# Patient Record
Sex: Male | Born: 2000 | Hispanic: Refuse to answer | Marital: Single | Smoking: Never smoker
Health system: Southern US, Community
[De-identification: ages and names within clinical notes are randomized; demographics above are authoritative.]

---
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
## 2018-01-01 ENCOUNTER — Other Ambulatory Visit: Payer: Self-pay | Admitting: Family Medicine

## 2018-01-01 MED ORDER — AZITHROMYCIN 500 MG PO TABS: ORAL_TABLET | ORAL | 0 refills | Status: DC

## 2018-01-01 MED ORDER — CEFIXIME 400 MG PO CAPS: ORAL_CAPSULE | ORAL | 0 refills | Status: AC

## 2018-01-02 NOTE — Progress Notes (Signed)
Patient presents today with history of STD exposure. Patient states that her partner told him that she was diagnosed with chlamydia. Patient had unprotected intercourse with this patient a few weeks ago. Patient denies any symptoms. He denies any history of STDs in the past. He denies any penile discharge, rash, fever, joint pain, dysuria, headache. He declines HIV testing. Patient requests testing for a minute area and gonorrhea.  ROS: Negative except mentioned above. Vitals as per Epic. GENERAL: NAD HEENT: no pharyngeal erythema, no exudate RESP: CTA B CARD: RRR GU: deferred NEURO: CN II-XII grossly intact   A/P: STD exposure - patient is unable to give a urine sample at this time, will have to follow up tomorrow to give sample, safe sex practices encouraged, recommend not having sexual relations until results have been reviewed and discussed with patient, will send Azithromycin and Cefixime prescriptions to pharmacy for pick up after patient has given in urine sample tomorrow. Seek medical attention if any further problems.

## 2018-01-03 LAB — CHLAMYDIA/GONOCOCCUS/TRICHOMONAS, NAA
CHLAMYDIA BY NAA: NEGATIVE
Gonococcus by NAA: NEGATIVE
Trich vag by NAA: NEGATIVE

## 2018-07-22 ENCOUNTER — Ambulatory Visit (INDEPENDENT_AMBULATORY_CARE_PROVIDER_SITE_OTHER): Payer: Managed Care, Other (non HMO) | Admitting: Family Medicine

## 2018-07-22 ENCOUNTER — Encounter: Payer: Self-pay | Admitting: Family Medicine

## 2018-07-22 VITALS — BP 137/73 | HR 96 | Temp 99.9°F | Resp 14

## 2018-07-22 DIAGNOSIS — J101 Influenza due to other identified influenza virus with other respiratory manifestations: Secondary | ICD-10-CM

## 2018-07-22 DIAGNOSIS — R509 Fever, unspecified: Secondary | ICD-10-CM

## 2018-07-22 LAB — POCT INFLUENZA A/B
INFLUENZA A, POC: NEGATIVE
INFLUENZA B, POC: POSITIVE — AB

## 2018-07-22 LAB — POCT RAPID STREP A (OFFICE): RAPID STREP A SCREEN: NEGATIVE

## 2018-07-22 MED ORDER — OSELTAMIVIR PHOSPHATE 75 MG PO CAPS: 75.0000 mg | ORAL_CAPSULE | Freq: Two times a day (BID) | ORAL | 0 refills | Status: AC

## 2018-07-22 NOTE — Progress Notes (Signed)
Patient presents today with symptoms of headache, cough, nasal congestion.  Patient states that he has had symptoms for the last 3 days.  He admits to having night sweats last night.  He denies any nausea, vomiting, diarrhea, abdominal pain, chest pain, shortness of breath, severe headache.  He denies getting a flu vaccination this year.  He has not taken any medications this morning.  He denies any sick contacts.  Patient admits that he has asthma but has not had to use his inhaler.  ROS: Negative except mentioned above. Vitals as per Epic. GENERAL: NAD HEENT: mild pharyngeal erythema, no exudate, no erythema of TMs, no cervical LAD RESP: CTA B CARD: RRR NEURO: CN II-XII grossly intact   A/P: Influenza B -rapid strep test negative, flu screen positive for Influenza B, Tylenol/Ibuprofen as needed, rest, hydration, Tamiflu prescribed if patient wants to take it, cough suppressant as needed, use Albuterol Inhaler if needed, can return back to athletic activity and class once afebrile for 24 hours without taking fever lowering medication, advised patient to inform academic advisor and professors, will inform athletic trainer, seek medical attention if symptoms persist or worsen as discussed.

## 2018-11-27 ENCOUNTER — Other Ambulatory Visit: Payer: Self-pay | Admitting: *Deleted

## 2018-11-27 DIAGNOSIS — Z20822 Contact with and (suspected) exposure to covid-19: Secondary | ICD-10-CM

## 2018-11-27 NOTE — Progress Notes (Signed)
covid testing

## 2018-12-01 ENCOUNTER — Other Ambulatory Visit: Payer: Self-pay

## 2018-12-01 DIAGNOSIS — Z20822 Contact with and (suspected) exposure to covid-19: Secondary | ICD-10-CM

## 2018-12-06 LAB — NOVEL CORONAVIRUS, NAA: SARS-CoV-2, NAA: NOT DETECTED

## 2019-01-05 ENCOUNTER — Other Ambulatory Visit: Payer: Self-pay

## 2019-01-05 DIAGNOSIS — Z20822 Contact with and (suspected) exposure to covid-19: Secondary | ICD-10-CM

## 2019-01-06 LAB — NOVEL CORONAVIRUS, NAA: SARS-CoV-2, NAA: NOT DETECTED

## 2019-01-21 ENCOUNTER — Other Ambulatory Visit: Payer: Self-pay | Admitting: Family Medicine

## 2019-01-21 DIAGNOSIS — Z1159 Encounter for screening for other viral diseases: Secondary | ICD-10-CM

## 2019-02-16 ENCOUNTER — Other Ambulatory Visit: Payer: Self-pay

## 2019-02-16 DIAGNOSIS — U071 COVID-19: Secondary | ICD-10-CM

## 2019-02-17 LAB — NOVEL CORONAVIRUS, NAA: SARS-CoV-2, NAA: NOT DETECTED

## 2019-02-19 ENCOUNTER — Other Ambulatory Visit: Payer: Self-pay

## 2019-02-19 DIAGNOSIS — Z20822 Contact with and (suspected) exposure to covid-19: Secondary | ICD-10-CM

## 2019-02-20 LAB — NOVEL CORONAVIRUS, NAA: SARS-CoV-2, NAA: NOT DETECTED

## 2019-09-17 ENCOUNTER — Encounter: Payer: Self-pay | Admitting: Family

## 2019-09-17 ENCOUNTER — Other Ambulatory Visit: Payer: Self-pay

## 2019-09-17 ENCOUNTER — Ambulatory Visit: Payer: Self-pay | Admitting: Family

## 2019-09-17 VITALS — BP 141/62 | HR 67 | Temp 97.7°F | Resp 14

## 2019-09-17 DIAGNOSIS — Z202 Contact with and (suspected) exposure to infections with a predominantly sexual mode of transmission: Secondary | ICD-10-CM

## 2019-09-17 DIAGNOSIS — Z113 Encounter for screening for infections with a predominantly sexual mode of transmission: Secondary | ICD-10-CM

## 2019-09-17 MED ORDER — AZITHROMYCIN 500 MG PO TABS: ORAL_TABLET | ORAL | 0 refills | Status: AC

## 2019-09-17 NOTE — Progress Notes (Signed)
   Established Patient Office Visit  Subjective:  Patient ID: Jesse Sanchez, male    DOB: [birth year]  Age: 19 y.o. MRN: [id number]  CC:  Chief Complaint  Patient presents with  . Labs Only    STI screen    HPI Virgil Lightner presents with c/o exposure to Chlamydia. He had unprotected sex last week.  Partner just contacted him that she was positive.  Pt denies symptoms. Hx of Chlamydia exposure in the past.  No past medical history on file.  No past surgical history on file.  No family history on file.   No Known Allergies  ROS Review of Systems  Genitourinary: Negative for difficulty urinating, discharge, dysuria, penile pain, scrotal swelling, testicular pain and urgency.      Objective:    Physical Exam  Constitutional: He appears well-developed and well-nourished.    BP (!) 141/62   Pulse 67   Temp 97.7 F (36.5 C) (Temporal)   Resp 14   SpO2 100%  Wt Readings from Last 3 Encounters:  No data found for Wt      Assessment & Plan:   Problem List Items Addressed This Visit    None    Visit Diagnoses    Exposure to sexually transmitted disease (STD)    -  Primary   Screening for venereal disease         Urine GC DNA obtained.  Reviewed CDC guidelines for expedited partner treatment.  Azithromycin 1g sent to [facility] on [address]  Safe sex practices reviewed.  No sex x 2 weeks.  Rtc prn.  He will obtain HIV and RPR testing at 3 month follow up test.  Archimedes Harold, Deirdre Peer, NP

## 2019-09-19 LAB — CHLAMYDIA/GC NAA, CONFIRMATION
Chlamydia trachomatis, NAA: NEGATIVE
Neisseria gonorrhoeae, NAA: NEGATIVE

## 2020-02-04 ENCOUNTER — Ambulatory Visit
Admission: RE | Admit: 2020-02-04 | Discharge: 2020-02-04 | Disposition: A | Discharge status: Home / Self Care | Payer: BLUE CROSS/BLUE SHIELD | Source: Ambulatory Visit | Attending: Sports Medicine | Admitting: Sports Medicine

## 2020-02-04 ENCOUNTER — Ambulatory Visit
Admission: RE | Admit: 2020-02-04 | Discharge: 2020-02-04 | Disposition: A | Discharge status: Home / Self Care | Payer: BLUE CROSS/BLUE SHIELD | Attending: Sports Medicine | Admitting: Sports Medicine

## 2020-02-04 ENCOUNTER — Other Ambulatory Visit: Payer: Self-pay

## 2020-02-04 ENCOUNTER — Other Ambulatory Visit: Payer: Self-pay | Admitting: Sports Medicine

## 2020-02-04 DIAGNOSIS — R52 Pain, unspecified: Secondary | ICD-10-CM | POA: Insufficient documentation

## 2021-03-25 IMAGING — CR DG ANKLE COMPLETE 3+V*L*
1 series · 3 of 3 positions shown · non-contrast
Comparison: None.

CLINICAL DATA: Recent football injury several days ago with
persistent ankle pain, initial encounter

EXAM:
LEFT ANKLE COMPLETE - 3+ VIEW

[Series 1: dg ankle complete left · 0.14mm/px · 3 of 3 slices shown]
[im 1/3]
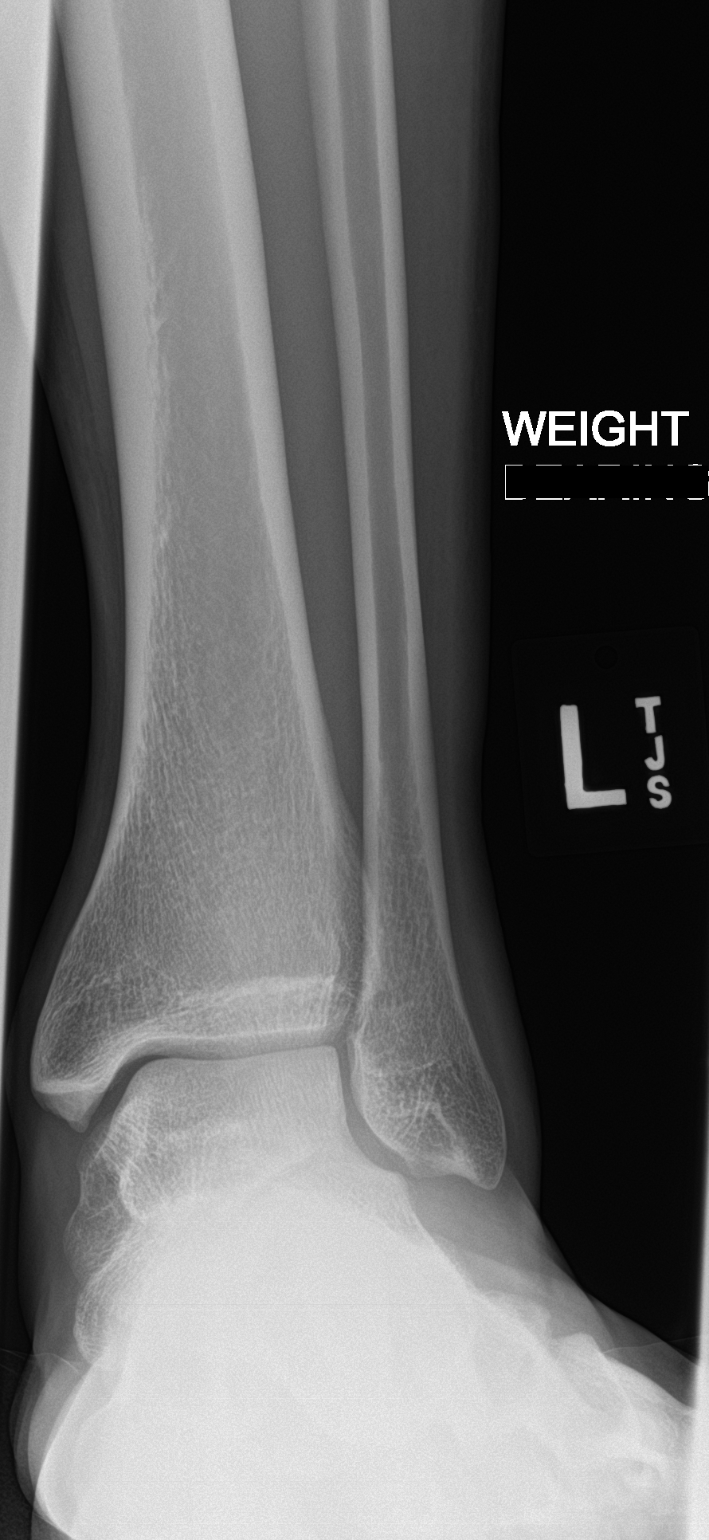
[im 2/3]
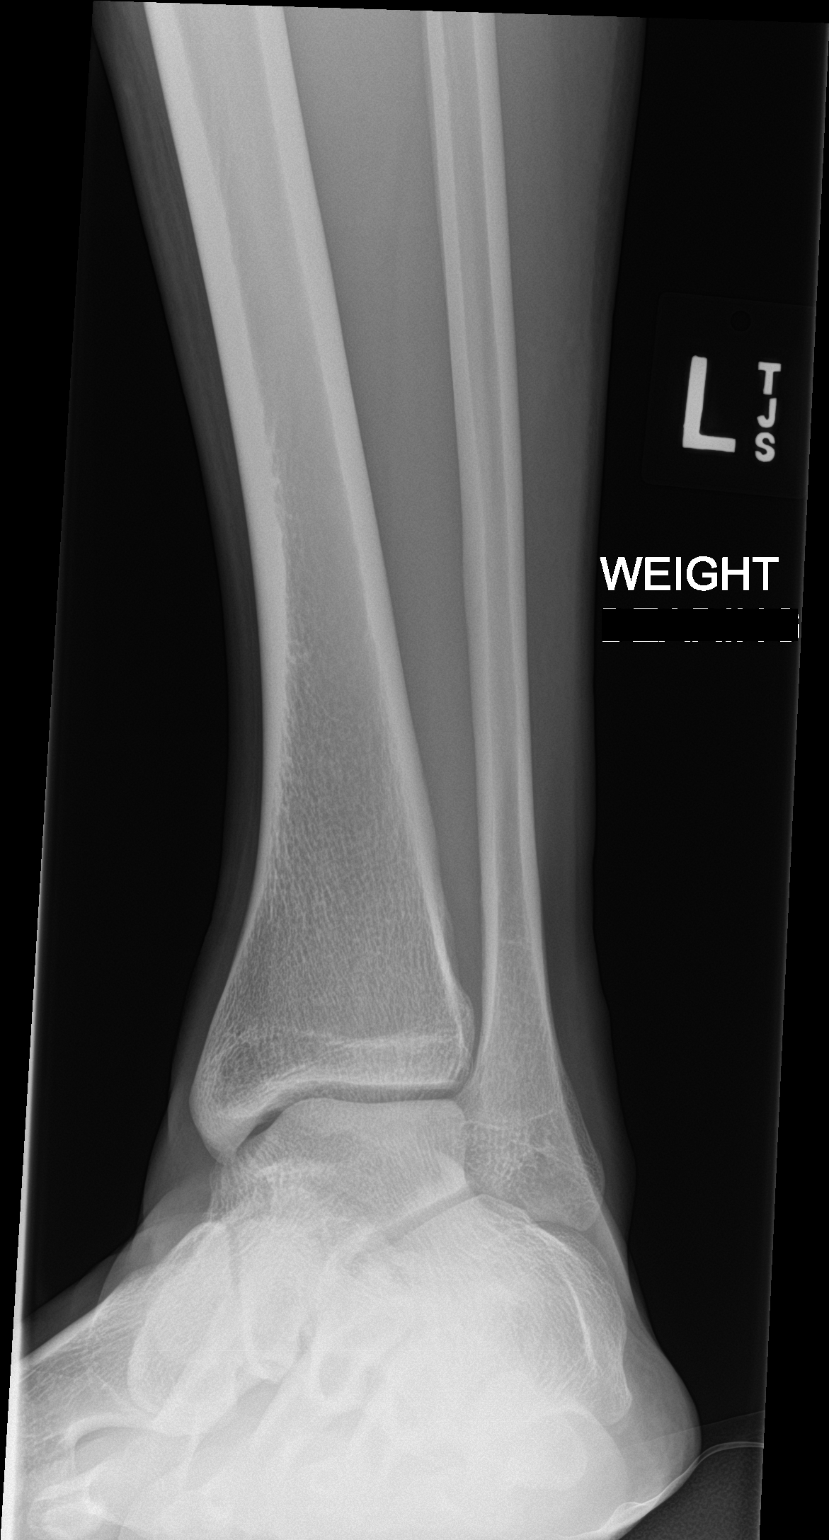
[im 3/3]
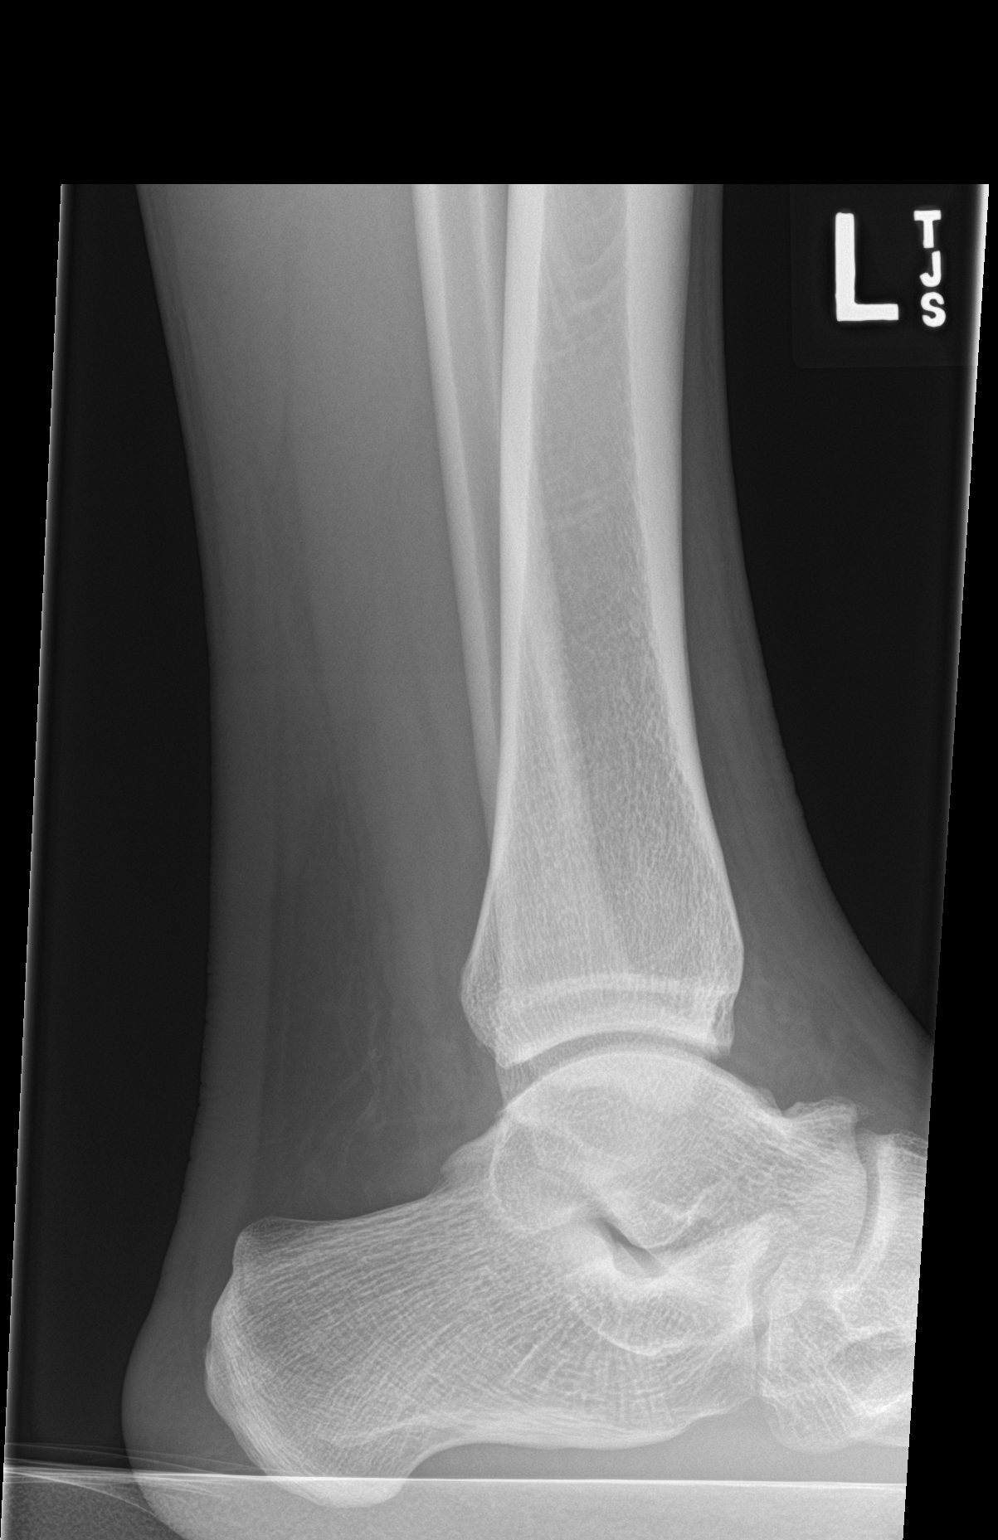

[3 of 3 positions shown; findings below may reference images not displayed]

FINDINGS: There is no evidence of fracture, dislocation, or joint effusion.
There is no evidence of arthropathy or other focal bone abnormality.
Soft tissues are unremarkable.
IMPRESSION: No acute abnormality noted.
